# Patient Record
Sex: Male | Born: 1995
Health system: Southern US, Community
[De-identification: ages and names within clinical notes are randomized; demographics above are authoritative.]

## PROBLEM LIST (undated history)

## (undated) DIAGNOSIS — J45909 Unspecified asthma, uncomplicated: Secondary | ICD-10-CM

## (undated) DIAGNOSIS — B20 Human immunodeficiency virus [HIV] disease: Secondary | ICD-10-CM

---
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
## 2015-08-18 ENCOUNTER — Telehealth: Payer: Self-pay

## 2015-08-18 NOTE — Telephone Encounter (Signed)
Called patient to offer new patient intake. Left message on machine.  Tammy K King, RN   

## 2015-09-17 ENCOUNTER — Telehealth: Payer: Self-pay

## 2015-09-17 NOTE — Telephone Encounter (Signed)
Patient missed initial call. He is calling to reschedule.   Return call no answer at [phone] number left on voice mail  Also tried [phone]  Finally left message @ [phone]

## 2015-10-06 ENCOUNTER — Telehealth: Payer: Self-pay

## 2015-10-06 NOTE — Telephone Encounter (Signed)
Patient left message on my voice mail : he is ready to make new intake appointment.    Returned call to [phone] and he was not at home. I left a message to call Erine Phenix with the person who answered the phone.   I will need his insurance information to see if he need a financial counselor appointment prior to scheduling an office visit .

## 2015-10-28 ENCOUNTER — Ambulatory Visit: Payer: BLUE CROSS/BLUE SHIELD | Admitting: *Deleted

## 2015-10-28 ENCOUNTER — Other Ambulatory Visit (HOSPITAL_COMMUNITY)
Admission: RE | Admit: 2015-10-28 | Discharge: 2015-10-28 | Disposition: A | Discharge status: Home / Self Care | Payer: BLUE CROSS/BLUE SHIELD | Source: Ambulatory Visit | Attending: Internal Medicine | Admitting: Internal Medicine

## 2015-10-28 ENCOUNTER — Ambulatory Visit (INDEPENDENT_AMBULATORY_CARE_PROVIDER_SITE_OTHER): Payer: BLUE CROSS/BLUE SHIELD | Admitting: Internal Medicine

## 2015-10-28 VITALS — BP 128/78 | HR 67 | Temp 97.9°F | Ht 70.0 in | Wt 156.0 lb

## 2015-10-28 DIAGNOSIS — Z79899 Other long term (current) drug therapy: Secondary | ICD-10-CM | POA: Diagnosis not present

## 2015-10-28 DIAGNOSIS — Z113 Encounter for screening for infections with a predominantly sexual mode of transmission: Secondary | ICD-10-CM

## 2015-10-28 DIAGNOSIS — B20 Human immunodeficiency virus [HIV] disease: Secondary | ICD-10-CM | POA: Diagnosis not present

## 2015-10-28 DIAGNOSIS — F329 Major depressive disorder, single episode, unspecified: Secondary | ICD-10-CM

## 2015-10-28 DIAGNOSIS — Z23 Encounter for immunization: Secondary | ICD-10-CM

## 2015-10-28 DIAGNOSIS — F32A Depression, unspecified: Secondary | ICD-10-CM

## 2015-10-28 LAB — CBC WITH DIFFERENTIAL/PLATELET
BASOS PCT: 1 %
Basophils Absolute: 27 cells/uL (ref 0–200)
EOS PCT: 8 %
Eosinophils Absolute: 216 cells/uL (ref 15–500)
HCT: 43.5 % (ref 38.5–50.0)
Hemoglobin: 15.1 g/dL (ref 13.2–17.1)
Lymphocytes Relative: 33 %
Lymphs Abs: 891 cells/uL (ref 850–3900)
MCH: 31.3 pg (ref 27.0–33.0)
MCHC: 34.7 g/dL (ref 32.0–36.0)
MCV: 90.2 fL (ref 80.0–100.0)
MONOS PCT: 14 %
MPV: 9.9 fL (ref 7.5–12.5)
Monocytes Absolute: 378 cells/uL (ref 200–950)
NEUTROS ABS: 1188 {cells}/uL — AB (ref 1500–7800)
Neutrophils Relative %: 44 %
PLATELETS: 201 10*3/uL (ref 140–400)
RBC: 4.82 MIL/uL (ref 4.20–5.80)
RDW: 14.3 % (ref 11.0–15.0)
WBC: 2.7 10*3/uL — ABNORMAL LOW (ref 3.8–10.8)

## 2015-10-28 LAB — COMPLETE METABOLIC PANEL WITH GFR
ALBUMIN: 4.1 g/dL (ref 3.6–5.1)
ALK PHOS: 55 U/L (ref 40–115)
ALT: 16 U/L (ref 9–46)
AST: 23 U/L (ref 10–40)
BILIRUBIN TOTAL: 0.7 mg/dL (ref 0.2–1.2)
BUN: 8 mg/dL (ref 7–25)
CO2: 25 mmol/L (ref 20–31)
Calcium: 9.2 mg/dL (ref 8.6–10.3)
Chloride: 104 mmol/L (ref 98–110)
Creat: 1.12 mg/dL (ref 0.60–1.35)
Glucose, Bld: 92 mg/dL (ref 65–99)
Potassium: 4.3 mmol/L (ref 3.5–5.3)
Sodium: 139 mmol/L (ref 135–146)
TOTAL PROTEIN: 7.3 g/dL (ref 6.1–8.1)

## 2015-10-28 LAB — LIPID PANEL
CHOL/HDL RATIO: 4.2 ratio (ref <=5.0)
CHOLESTEROL: 125 mg/dL (ref 125–200)
HDL: 30 mg/dL — AB (ref >=40)
LDL Cholesterol: 84 mg/dL (ref <130)
Triglycerides: 55 mg/dL (ref <150)
VLDL: 11 mg/dL (ref <30)

## 2015-10-28 NOTE — Progress Notes (Signed)
Patient ID: Jacob Jacobson, male    DOB: 09/01/1995, 20 y.o.   MRN: [id number]  Reason for visit: to establish care as a new patient with HIV  HPI:   Patient was first diagnosed about [facility]  He was tested as part routine screening.  Previously tested about 2-3 years prior as negative.  The CD4 count is unknown, viral load unknown.  There have been no associated symptoms.  Some possible lymphadenopathy in axillary area, resolved now.  Does not know when he became infected.  No labs prior to visit.  No medications, no other medical issues.  Mother is a NP and is aware of diagnosis.  Interested in treatment and ok to take daily.  Endorses MSM and heterosexual sex.  No current partners since diagnosis.   PMHx: HIV       SHx: denies smoking, no alcohol  FHx: no renal disease, no heart disease, no HTN in any family members  Review of Systems Constitutional: negative for fatigue, malaise, anorexia and weight loss Cardiovascular: negative for chest pressure/discomfort, dyspnea Gastrointestinal: negative for nausea, vomiting, diarrhea and abdominal pain Genitourinary: negative for frequency and genital lesions and penile discharge Musculoskeletal: negative for myalgias and arthralgias All other systems reviewed and are negative   CONSTITUTIONAL:in no apparent distress and alert  Vitals:   10/28/15 1007  BP: 128/78  Pulse: 67  Temp: 97.9 F (36.6 C)   EYES: anicteric HENT: no thrush CARD:Cor RRR and No murmurs RESP:CTA B; normal respiratory effort GM:WNUUVGI:Bowel sounds are normal, liver is not enlarged, spleen is not enlarged MS:no pedal edema noted SKIN: no rashes NEURO: non-focal GU: no penile lesions, no discharge, no inguinal lad, normal testes  Assessment: new patient here with HIV.  Discussed with patient treatment options and side effects, benefits of treatment, long term outcomes.  I discussed the severity of untreated HIV including higher  cancer risk, opportunistic infections, renal failure.  Also discussed needing to use condoms, partner disclosure, necessary vaccines, blood monitoring.  All questions answered.    Plan: 1) labs today 2) Genvoya if no baseline resistance; discussed with Irving BurtonEmily PharmD 3) follow up with PharmD in about 2 weeks to start Genvoya once labs are back if no issues 4) labs in about 5 weeks (if on medication in 2 weeks)  5) follow up with me in 6 weeks 6) flu shot and pneumovax today 7) hepatitis A and B depending on titers

## 2015-10-28 NOTE — BH Specialist Note (Signed)
Counselor met with Jacob Jacobson in the exam room for a warm handoff as patient is new.  Patient arrived an hour late but was in good spirits. Patient was oriented times four with good affect and dress.  Counselor introduce self and communicated to patient about the counseling services available to him if and when he had a need.  Counselor provided a contact card and encouraged patient to make an appointment anytime.   Rolena Infante, MA, LPC [facility]

## 2015-10-29 LAB — T-HELPER CELL (CD4) - (RCID CLINIC ONLY)
CD4 % Helper T Cell: 25 % — ABNORMAL LOW (ref 33–55)
CD4 T Cell Abs: 240 /uL — ABNORMAL LOW (ref 400–2700)

## 2015-10-29 LAB — URINE CYTOLOGY ANCILLARY ONLY
Chlamydia: NEGATIVE
Neisseria Gonorrhea: NEGATIVE

## 2015-10-29 LAB — HEPATITIS B SURFACE ANTIGEN: Hepatitis B Surface Ag: NEGATIVE

## 2015-10-29 LAB — RPR

## 2015-10-29 LAB — HEPATITIS B SURFACE ANTIBODY,QUALITATIVE: Hep B S Ab: NEGATIVE

## 2015-10-29 LAB — HEPATITIS A ANTIBODY, TOTAL: HEP A TOTAL AB: REACTIVE — AB

## 2015-10-29 LAB — HEPATITIS B CORE ANTIBODY, TOTAL: Hep B Core Total Ab: NONREACTIVE

## 2015-10-29 LAB — HEPATITIS C ANTIBODY: HCV AB: NEGATIVE

## 2015-10-30 LAB — HIV ANTIBODY (ROUTINE TESTING W REFLEX): HIV: REACTIVE — AB

## 2015-10-30 LAB — HIV 1/2 CONFIRMATION
HIV-1 antibody: POSITIVE — AB
HIV-2 Ab: NEGATIVE

## 2015-10-30 LAB — QUANTIFERON TB GOLD ASSAY (BLOOD)
INTERFERON GAMMA RELEASE ASSAY: NEGATIVE
QUANTIFERON NIL VALUE: 0.05 [IU]/mL

## 2015-11-01 LAB — HIV-1 RNA QUANT-NO REFLEX-BLD
HIV 1 RNA Quant: 60440 copies/mL — ABNORMAL HIGH (ref <20)
HIV-1 RNA Quant, Log: 4.78 Log copies/mL — ABNORMAL HIGH (ref <1.30)

## 2015-11-04 ENCOUNTER — Encounter: Payer: Self-pay | Admitting: *Deleted

## 2015-11-04 LAB — HLA B*5701: HLA-B 5701 W/RFLX HLA-B HIGH: NEGATIVE

## 2015-11-11 ENCOUNTER — Ambulatory Visit: Payer: BLUE CROSS/BLUE SHIELD

## 2015-12-02 ENCOUNTER — Other Ambulatory Visit: Payer: BLUE CROSS/BLUE SHIELD

## 2015-12-09 ENCOUNTER — Ambulatory Visit: Payer: BLUE CROSS/BLUE SHIELD | Admitting: Internal Medicine

## 2015-12-21 ENCOUNTER — Ambulatory Visit: Payer: BLUE CROSS/BLUE SHIELD | Admitting: Internal Medicine

## 2015-12-23 ENCOUNTER — Ambulatory Visit: Payer: BLUE CROSS/BLUE SHIELD | Admitting: Internal Medicine

## 2016-02-14 ENCOUNTER — Ambulatory Visit: Payer: BLUE CROSS/BLUE SHIELD | Admitting: Internal Medicine

## 2016-02-24 ENCOUNTER — Telehealth: Payer: Self-pay | Admitting: *Deleted

## 2016-02-24 NOTE — Telephone Encounter (Signed)
RN received a referral from Dr Luciana Axeomer to offer HIV case management services. On review of the chart [place] tested positive the later part of last year and never received treatment for HIV.   RN contact [place] at the number listed and left a vague message since the voicemail does not identify the patient by name. Rn asked that [place] return my call or text.   RN attempted to send a email but the email address provided is not a accurate email address

## 2016-02-25 ENCOUNTER — Telehealth: Payer: Self-pay

## 2016-02-25 NOTE — Telephone Encounter (Signed)
Aliza from [facility] called to ask if Jacob Jacobson was in care at all in our clinic. I told her no and we both confirmed that the last time Pt had been seen was 10/2015. Ot has not yet started treatment following his positive HIV status and his name is now involved in a new case. Multiple sources have tried to contact Pt though phone, mail and e-mail yet there's been no one had yet to talk to Jacob Jacobson.

## 2016-03-20 ENCOUNTER — Other Ambulatory Visit: Payer: BLUE CROSS/BLUE SHIELD

## 2016-04-03 ENCOUNTER — Encounter: Payer: Self-pay | Admitting: Internal Medicine

## 2016-04-03 ENCOUNTER — Ambulatory Visit (INDEPENDENT_AMBULATORY_CARE_PROVIDER_SITE_OTHER): Payer: BLUE CROSS/BLUE SHIELD | Admitting: Internal Medicine

## 2016-04-03 VITALS — BP 110/70 | HR 89 | Temp 98.0°F | Wt 148.0 lb

## 2016-04-03 DIAGNOSIS — Z23 Encounter for immunization: Secondary | ICD-10-CM | POA: Insufficient documentation

## 2016-04-03 DIAGNOSIS — B20 Human immunodeficiency virus [HIV] disease: Secondary | ICD-10-CM | POA: Diagnosis not present

## 2016-04-03 DIAGNOSIS — Z7189 Other specified counseling: Secondary | ICD-10-CM

## 2016-04-03 DIAGNOSIS — Z7185 Encounter for immunization safety counseling: Secondary | ICD-10-CM

## 2016-04-03 MED ORDER — ELVITEG-COBIC-EMTRICIT-TENOFAF 150-150-200-10 MG PO TABS: 1.0000 | ORAL_TABLET | Freq: Every day | ORAL | 5 refills | Status: DC

## 2016-04-03 NOTE — Assessment & Plan Note (Addendum)
Will start genvoya. copay card given.  Labs in 4 weeks and follow up with me after.   Counseled on compliance, side effects.

## 2016-04-03 NOTE — Assessment & Plan Note (Signed)
Meningitis vaccine confirmed in [facility] database.  Hepatitis B series today. Will do #2 at next visit with me.

## 2016-04-03 NOTE — Progress Notes (Signed)
CC: Follow up for HIV  Interval history: I saw once and he didn't return until now.  HIV confimred in October 2017 and CD4 of 240.  Now back and ready to start medication.  No questions.  Had meningitis vaccine with his school.  Hepatitis B non-immune.  No new issues including no n/v/d.  No weight loss.    Prior to Admission medications   Medication Sig Start Date End Date Taking? Authorizing Provider  elvitegravir-cobicistat-emtricitabine-tenofovir (GENVOYA) 150-150-200-10 MG TABS tablet Take 1 tablet by mouth daily. 04/03/16   Gardiner Barefoot, MD    Review of Systems Constitutional: negative for fatigue and malaise Musculoskeletal: negative for myalgias and arthralgias All other systems reviewed and are negative    Physical Exam: CONSTITUTIONAL:in no apparent distress and alert  Vitals:   04/03/16 1043  BP: 110/70  Pulse: 89  Temp: 98 F (36.7 C)   Eyes: anicteric HENT: no thrush, no cervical lymphadenopathy Respiratory: Normal respiratory effort; CTA B Cardiovascular: RRR  Lab Results  Component Value Date   HIV1RNAQUANT 60,440 (H) 10/28/2015   No components found for: HIV1GENOTYPRPLUS No components found for: THELPERCELL

## 2016-04-24 ENCOUNTER — Other Ambulatory Visit: Payer: BLUE CROSS/BLUE SHIELD

## 2016-08-14 ENCOUNTER — Emergency Department (HOSPITAL_COMMUNITY)
Admission: EM | Admit: 2016-08-14 | Discharge: 2016-08-15 | Disposition: A | Discharge status: Home / Self Care | Payer: BLUE CROSS/BLUE SHIELD | Attending: Emergency Medicine | Admitting: Emergency Medicine

## 2016-08-14 ENCOUNTER — Emergency Department (HOSPITAL_COMMUNITY): Payer: BLUE CROSS/BLUE SHIELD

## 2016-08-14 ENCOUNTER — Encounter (HOSPITAL_COMMUNITY): Payer: Self-pay | Admitting: Emergency Medicine

## 2016-08-14 DIAGNOSIS — B20 Human immunodeficiency virus [HIV] disease: Secondary | ICD-10-CM | POA: Diagnosis not present

## 2016-08-14 DIAGNOSIS — S61511A Laceration without foreign body of right wrist, initial encounter: Secondary | ICD-10-CM | POA: Diagnosis not present

## 2016-08-14 DIAGNOSIS — Y939 Activity, unspecified: Secondary | ICD-10-CM | POA: Insufficient documentation

## 2016-08-14 DIAGNOSIS — Y92008 Other place in unspecified non-institutional (private) residence as the place of occurrence of the external cause: Secondary | ICD-10-CM | POA: Diagnosis not present

## 2016-08-14 DIAGNOSIS — J45909 Unspecified asthma, uncomplicated: Secondary | ICD-10-CM | POA: Diagnosis not present

## 2016-08-14 DIAGNOSIS — Z23 Encounter for immunization: Secondary | ICD-10-CM | POA: Diagnosis not present

## 2016-08-14 DIAGNOSIS — Y999 Unspecified external cause status: Secondary | ICD-10-CM | POA: Insufficient documentation

## 2016-08-14 DIAGNOSIS — W25XXXA Contact with sharp glass, initial encounter: Secondary | ICD-10-CM | POA: Insufficient documentation

## 2016-08-14 HISTORY — DX: Unspecified asthma, uncomplicated: J45.909

## 2016-08-14 HISTORY — DX: Human immunodeficiency virus (HIV) disease: B20

## 2016-08-14 MED ORDER — LIDOCAINE-EPINEPHRINE (PF) 2 %-1:200000 IJ SOLN
20.0000 mL | Freq: Once | INTRAMUSCULAR | Status: AC
  Administered 2016-08-14: 20 mL

## 2016-08-14 MED ORDER — TETANUS-DIPHTH-ACELL PERTUSSIS 5-2.5-18.5 LF-MCG/0.5 IM SUSP
0.5000 mL | Freq: Once | INTRAMUSCULAR | Status: AC
  Administered 2016-08-14: 0.5 mL via INTRAMUSCULAR
  Filled 2016-08-14: qty 0.5

## 2016-08-14 NOTE — ED Provider Notes (Signed)
[facility]-EMERGENCY DEPT Provider Note   CSN: [id number] Arrival date & time: 08/14/16  2113  By signing my name below, I, Diona BrownerJennifer Gorman, attest that this documentation has been prepared under the direction and in the presence of Mathews RobinsonsJessica Zendayah Hardgrave, PA-C. Electronically Signed: Diona BrownerJennifer Gorman, ED Scribe. 08/14/16. 11:40 PM.  History   Chief Complaint Chief Complaint  Patient presents with  . Extremity Laceration    HPI Jacob Jacobson is a 21 y.o. male with a PMHx of HIV and asthma, who presents to the Emergency Department complaining of a wound to his right wrist that occurred PTA. Pt was banging on the glass part of the door at his grandma's house when the glass broke and cut his wrist and right middle finger knuckle. Bleeding controlled. Pt didn't take anything for his pain. He is not taking any medication for his HIV, because he hasn't set up his card yet. Notes he will be doing that later this week. Tingling to his forearm. Pt denies fever, chills, numbness, weakness, or any other complaints at this time.   The history is provided by the patient. No language interpreter was used.    Past Medical History:  Diagnosis Date  . Asthma   . HIV (human immunodeficiency virus infection) [facility](HCC)     Patient Active Problem List   Diagnosis Date Noted  . Vaccine counseling 04/03/2016  . Human immunodeficiency virus I infection (HCC) 10/28/2015  . Screening examination for venereal disease 10/28/2015  . Encounter for long-term (current) use of medications 10/28/2015    History reviewed. No pertinent surgical history.     Home Medications    Prior to Admission medications   Medication Sig Start Date End Date Taking? Authorizing Provider  cephALEXin (KEFLEX) 500 MG capsule Take 1 capsule (500 mg total) by mouth 2 (two) times daily. 08/15/16 08/20/16  Georgiana ShoreMitchell, Eupha Lobb B, PA-C  elvitegravir-cobicistat-emtricitabine-tenofovir (GENVOYA) 150-150-200-10 MG TABS tablet Take 1 tablet by mouth  daily. 04/03/16   Comer, Belia Hemanobert W, MD    Family History No family history on file.  Social History Social History  Substance Use Topics  . Smoking status: Never Smoker  . Smokeless tobacco: Never Used  . Alcohol use No     Allergies   Patient has no known allergies.   Review of Systems Review of Systems  Constitutional: Negative for chills and fever.  Respiratory: Negative for shortness of breath.   Cardiovascular: Negative for chest pain.  Gastrointestinal: Negative for nausea and vomiting.  Skin: Positive for wound. Negative for pallor.  Neurological: Negative for dizziness, weakness, light-headedness and numbness.     Physical Exam Updated Vital Signs BP 113/75 (BP Location: Left Arm)   Pulse 88   Temp 98.4 F (36.9 C) (Oral)   Resp 16   SpO2 98%   Physical Exam  Constitutional: He appears well-developed and well-nourished. No distress.  Patient is afebrile, non-toxic appearing, sitting comfortably in chair in no acute distress.  HENT:  Head: Normocephalic and atraumatic.  Eyes: Conjunctivae are normal.  Neck: Normal range of motion.  Cardiovascular: Normal rate, regular rhythm, normal heart sounds and intact distal pulses.   Pulmonary/Chest: Effort normal and breath sounds normal. No respiratory distress. He has no wheezes. He has no rales.  Abdominal: He exhibits no distension.  Musculoskeletal: Normal range of motion. He exhibits no edema.       Right wrist: He exhibits laceration.       Arms:      Hands: Neurological: He is alert. No  sensory deficit.  5/5 grips bilaterally  Skin: Skin is warm and dry. He is not diaphoretic. No pallor.  Psychiatric: He has a normal mood and affect. His behavior is normal.  Nursing note and vitals reviewed.          ED Treatments / Results  DIAGNOSTIC STUDIES: Oxygen Saturation is 98% on RA, normal by my interpretation.   COORDINATION OF CARE: 11:40 PM-Discussed next steps with pt. Pt verbalized  understanding and is agreeable with the plan.   Labs (all labs ordered are listed, but only abnormal results are displayed) Labs Reviewed - No data to display  EKG  EKG Interpretation None       Radiology Dg Forearm Right  Result Date: 08/14/2016 CLINICAL DATA:  Lacerations to the right forearm from glass window. Initial encounter. EXAM: RIGHT FOREARM - 2 VIEW COMPARISON:  None. FINDINGS: There is no evidence of fracture or dislocation. A prominent soft tissue laceration is noted overlying the distal ulna. No radiopaque foreign bodies are seen. The carpal rows appear grossly intact, and demonstrate normal alignment. The elbow joint is grossly unremarkable in appearance. IMPRESSION: No evidence of fracture or dislocation. No radiopaque foreign bodies seen. Electronically Signed   By: Roanna Raider M.D.   On: 08/14/2016 21:58   Dg Hand Complete Right  Result Date: 08/14/2016 CLINICAL DATA:  Laceration to the right third finger from glass window. Initial encounter. EXAM: RIGHT HAND - COMPLETE 3+ VIEW COMPARISON:  None. FINDINGS: There is no evidence of fracture or dislocation. The joint spaces are preserved. The carpal rows are intact, and demonstrate normal alignment. The soft tissue laceration at the third finger is not well characterized. No radiopaque foreign bodies are seen. IMPRESSION: No evidence of fracture or dislocation. No radiopaque foreign bodies seen. Electronically Signed   By: Roanna Raider M.D.   On: 08/14/2016 21:59    Procedures Procedures (including critical care time) LACERATION REPAIR #1 Performed by: Georgiana Shore Authorized by: Georgiana Shore Consent: Verbal consent obtained. Risks and benefits: risks, benefits and alternatives were discussed Consent given by: patient Patient identity confirmed: provided demographic data Prepped and Draped in normal sterile fashion Wound explored  Laceration Location: right wrist lateral  Laceration Length: 1  cm  No Foreign Bodies seen or palpated  Anesthesia: local infiltration  Local anesthetic: lidocaine 2% with epinephrine  Anesthetic total: 2 ml  Irrigation method: syringe Amount of cleaning: standard  Skin closure: non-absorbable prolene  Number of sutures: 3  Technique: simple interrupted  Patient tolerance: Patient tolerated the procedure well with no immediate complications.  LACERATION REPAIR #2 Performed by: Georgiana Shore Authorized by: Georgiana Shore Consent: Verbal consent obtained. Risks and benefits: risks, benefits and alternatives were discussed Consent given by: patient Patient identity confirmed: provided demographic data Prepped and Draped in normal sterile fashion Wound explored  Laceration Location: right wrist midline  Laceration Length: 5cm  No Foreign Bodies seen or palpated  Anesthesia: local infiltration  Local anesthetic: lidocaine 2% with epinephrine  Anesthetic total: 6 ml  Irrigation method: syringe Amount of cleaning: standard  Skin closure: 5.0 prolene  Number of sutures: 6  Technique: horizontal mattress (1), stellate (1), simple interrupted (4)  Patient tolerance: Patient tolerated the procedure well with no immediate complications.  LACERATION REPAIR #3 Performed by: Georgiana Shore Authorized by: Georgiana Shore Consent: Verbal consent obtained. Risks and benefits: risks, benefits and alternatives were discussed Consent given by: patient Patient identity confirmed: provided demographic data Prepped and  Draped in normal sterile fashion Wound explored  Laceration Location: right wrist  Laceration Length: 3 cm  No Foreign Bodies seen or palpated  Anesthesia: local infiltration  Local anesthetic: lidocaine 2% with epinephrine  Anesthetic total: 4 ml  Irrigation method: syringe Amount of cleaning: standard  Skin closure: 5.0 prolene  Number of sutures: 7  Technique: horizontal mattress (1),  simple interrupted (6)  Patient tolerance: Patient tolerated the procedure well with no immediate complications.  SPLINT APPLICATION Date/Time: 2:25 AM Authorized by: Georgiana Shore Consent: Verbal consent obtained. Risks and benefits: risks, benefits and alternatives were discussed Consent given by: patient Splint applied by: orthopedic technician Location details: right wrist Splint type: volar long arm Post-procedure: The splinted body part was neurovascularly unchanged following the procedure. Patient tolerance: Patient tolerated the procedure well with no immediate complications.    Medications Ordered in ED Medications  acetaminophen (TYLENOL) tablet 650 mg (not administered)  lidocaine-EPINEPHrine (XYLOCAINE W/EPI) 2 %-1:200000 (PF) injection 20 mL (20 mLs Infiltration Given by Other 08/14/16 2359)  Tdap (BOOSTRIX) injection 0.5 mL (0.5 mLs Intramuscular Given 08/14/16 2353)     Initial Impression / Assessment and Plan / ED Course  I have reviewed the triage vital signs and the nursing notes.  Pertinent labs & imaging results that were available during my care of the patient were reviewed by me and considered in my medical decision making (see chart for details).     Patient presents with 3 lacerations to his right wrist. imaging negative for fx, dislocation or foreign body.  On exam, he is NVI distally, with strong grips bilaterally. Visible flexor tendon may have sustained tear. Patient had full rom and strong grips.  Discussed with Dr. Bebe Shaggy who examined the wound and recommends loose closure and close follow up with Hand when the office opens in the am.  Pressure irrigation performed. Wound explored and base of wound visualized in a bloodless field without evidence of foreign body.  Laceration occurred < 8 hours prior to repair which was well tolerated.  Tdap updated.  Pt has comorbidities to effect normal wound healing. Pt discharged with antibiotics.     Discussed suture home care with patient and answered questions. Pt to follow-up for wound check and suture removal in 7-10 days; they are to return to the ED sooner for signs of infection. Pt is hemodynamically stable with no complaints prior to dc.   Discharge home with abx and close follow up with Hand in the morning.  Discussed strict return precautions and advised to return to the emergency department if experiencing any new or worsening symptoms. Instructions were understood and patient agreed with discharge plan.  Final Clinical Impressions(s) / ED Diagnoses   Final diagnoses:  Laceration of right wrist, initial encounter    New Prescriptions New Prescriptions   CEPHALEXIN (KEFLEX) 500 MG CAPSULE    Take 1 capsule (500 mg total) by mouth 2 (two) times daily.   I personally performed the services described in this documentation, which was scribed in my presence. The recorded information has been reviewed and is accurate.     Georgiana Shore, PA-C 08/15/16 0231    Zadie Rhine, MD 08/15/16 [phone]

## 2016-08-14 NOTE — ED Triage Notes (Signed)
Patient was trying to get into his Grandma's house, knocking on the door which had a glass window and it busted and lacerated his right wrist.  Bleeding controlled.

## 2016-08-15 MED ORDER — ACETAMINOPHEN 325 MG PO TABS
650.0000 mg | ORAL_TABLET | Freq: Once | ORAL | Status: AC
  Administered 2016-08-15: 650 mg via ORAL
  Filled 2016-08-15: qty 2

## 2016-08-15 MED ORDER — CEPHALEXIN 500 MG PO CAPS: 500.0000 mg | ORAL_CAPSULE | Freq: Two times a day (BID) | ORAL | 0 refills | Status: AC

## 2016-08-15 NOTE — ED Notes (Signed)
Patient Alert and oriented X4. Stable and ambulatory. Patient verbalized understanding of the discharge instructions.  Patient belongings were taken by the patient.  

## 2016-08-15 NOTE — ED Notes (Signed)
Wounds repaired with sutures and bacitracin applied. Wounds dressed with non adherent dressing, gauze and coban.  Awaiting ortho tech for long arm splint.

## 2016-08-15 NOTE — ED Notes (Signed)
Ortho Tech at bedside.  

## 2016-08-15 NOTE — ED Provider Notes (Signed)
Patient seen/examined in the Emergency Department in conjunction with Midlevel Provider  Patient reports laceration to right wrist Exam : awake/alert, wound noted to volar surface, tendon exposed but he does not appear to have any weakness with wrist flexion or flexion of fingers Plan: wound closure, volar splint and f/u with Hand surgery today     Zadie RhineWickline, Wandy Bossler, MD 08/15/16 0150

## 2016-08-15 NOTE — Discharge Instructions (Signed)
As discussed, follow up with Dr. Mina MarbleWeingold first thing in the morning.   Monitor for any signs of infection. Return to be seen if swelling, increased pain, purulence, redness, warmth, fever or other concerning symptoms.  Your sutures will need to be removed in 7-10 days. Keep the area clean and dry and change bandages daily.

## 2016-08-15 NOTE — ED Notes (Signed)
[facility]

## 2017-09-19 ENCOUNTER — Encounter (HOSPITAL_COMMUNITY): Payer: Self-pay | Admitting: Obstetrics and Gynecology

## 2017-09-19 ENCOUNTER — Other Ambulatory Visit: Payer: Self-pay

## 2017-09-19 DIAGNOSIS — Z21 Asymptomatic human immunodeficiency virus [HIV] infection status: Secondary | ICD-10-CM | POA: Diagnosis not present

## 2017-09-19 DIAGNOSIS — S8992XA Unspecified injury of left lower leg, initial encounter: Secondary | ICD-10-CM | POA: Diagnosis present

## 2017-09-19 DIAGNOSIS — Y999 Unspecified external cause status: Secondary | ICD-10-CM | POA: Insufficient documentation

## 2017-09-19 DIAGNOSIS — Y9343 Activity, gymnastics: Secondary | ICD-10-CM | POA: Diagnosis not present

## 2017-09-19 DIAGNOSIS — Z79899 Other long term (current) drug therapy: Secondary | ICD-10-CM | POA: Insufficient documentation

## 2017-09-19 DIAGNOSIS — X501XXA Overexertion from prolonged static or awkward postures, initial encounter: Secondary | ICD-10-CM | POA: Diagnosis not present

## 2017-09-19 DIAGNOSIS — Y929 Unspecified place or not applicable: Secondary | ICD-10-CM | POA: Insufficient documentation

## 2017-09-19 DIAGNOSIS — S76312A Strain of muscle, fascia and tendon of the posterior muscle group at thigh level, left thigh, initial encounter: Secondary | ICD-10-CM | POA: Diagnosis not present

## 2017-09-19 DIAGNOSIS — J45909 Unspecified asthma, uncomplicated: Secondary | ICD-10-CM | POA: Insufficient documentation

## 2017-09-19 NOTE — ED Triage Notes (Signed)
Pt reports that he "pulled his gluteus muscle" and that he is having pain in his buttocks and left leg.

## 2017-09-20 ENCOUNTER — Emergency Department (HOSPITAL_COMMUNITY)
Admission: EM | Admit: 2017-09-20 | Discharge: 2017-09-20 | Disposition: A | Discharge status: Home / Self Care | Payer: BLUE CROSS/BLUE SHIELD | Attending: Emergency Medicine | Admitting: Emergency Medicine

## 2017-09-20 DIAGNOSIS — S76312A Strain of muscle, fascia and tendon of the posterior muscle group at thigh level, left thigh, initial encounter: Secondary | ICD-10-CM

## 2017-09-20 MED ORDER — TRAMADOL HCL 50 MG PO TABS: 50.0000 mg | ORAL_TABLET | Freq: Four times a day (QID) | ORAL | 0 refills | Status: AC | PRN

## 2017-09-20 MED ORDER — TRAMADOL HCL 50 MG PO TABS
50.0000 mg | ORAL_TABLET | Freq: Once | ORAL | Status: AC
  Administered 2017-09-20: 50 mg via ORAL
  Filled 2017-09-20: qty 1

## 2017-09-20 MED ORDER — NAPROXEN 500 MG PO TABS: 500.0000 mg | ORAL_TABLET | Freq: Two times a day (BID) | ORAL | 0 refills | Status: AC

## 2017-09-20 NOTE — ED Provider Notes (Signed)
[facility]-EMERGENCY DEPT Provider Note   CSN: [id number] Arrival date & time: 09/19/17  2205     History   Chief Complaint Chief Complaint  Patient presents with  . Leg Pain    HPI Jacob Jacobson is a 22 y.o. male.  Patient is a 22 year old male with history of asthma and HIV disease.  He presents today for evaluation of leg pain.  He reports attempting to do a split several days ago when he felt a pull in his upper left hamstring.  He has been experiencing pain since that time.  It is worse when he ambulates and walks.  He denies any numbness or weakness.  The history is provided by the patient.  Leg Pain   This is a new problem. Episode onset: 3 to 4 days ago. The problem occurs constantly. The problem has not changed since onset.Pain location: Left upper leg. The pain is moderate. Pertinent negatives include no numbness.    Past Medical History:  Diagnosis Date  . Asthma   . HIV (human immunodeficiency virus infection) [facility](HCC)     Patient Active Problem List   Diagnosis Date Noted  . Vaccine counseling 04/03/2016  . Human immunodeficiency virus I infection (HCC) 10/28/2015  . Screening examination for venereal disease 10/28/2015  . Encounter for long-term (current) use of medications 10/28/2015    No past surgical history on file.      Home Medications    Prior to Admission medications   Medication Sig Start Date End Date Taking? Authorizing Provider  elvitegravir-cobicistat-emtricitabine-tenofovir (GENVOYA) 150-150-200-10 MG TABS tablet Take 1 tablet by mouth daily. 04/03/16   Comer, Belia Hemanobert W, MD    Family History No family history on file.  Social History Social History   Tobacco Use  . Smoking status: Never Smoker  . Smokeless tobacco: Never Used  Substance Use Topics  . Alcohol use: No  . Drug use: Yes    Types: Marijuana     Allergies   Patient has no known allergies.   Review of Systems Review of Systems    Neurological: Negative for numbness.  All other systems reviewed and are negative.    Physical Exam Updated Vital Signs BP 112/63 (BP Location: Left Arm)   Pulse 89   Temp 98.3 F (36.8 C)   Resp 16   SpO2 100%   Physical Exam  Constitutional: He is oriented to person, place, and time. He appears well-developed and well-nourished. No distress.  HENT:  Head: Normocephalic and atraumatic.  Neck: Normal range of motion. Neck supple.  Musculoskeletal:  The left thigh and buttock appear grossly normal.  There is tenderness in the upper hamstring.  He is able to flex the knee, however with discomfort.  Distal PMS to the lower extremity is intact.  Neurological: He is alert and oriented to person, place, and time.  Skin: Skin is warm and dry. He is not diaphoretic.  Nursing note and vitals reviewed.    ED Treatments / Results  Labs (all labs ordered are listed, but only abnormal results are displayed) Labs Reviewed - No data to display  EKG None  Radiology No results found.  Procedures Procedures (including critical care time)  Medications Ordered in ED Medications - No data to display   Initial Impression / Assessment and Plan / ED Course  I have reviewed the triage vital signs and the nursing notes.  Pertinent labs & imaging results that were available during my care of the patient were  reviewed by me and considered in my medical decision making (see chart for details).  Patient appears to have a hamstring sprain or partial tear.  This will be treated with naproxen, tramadol, and follow-up as needed if not improving.  Final Clinical Impressions(s) / ED Diagnoses   Final diagnoses:  None    ED Discharge Orders    None       Geoffery Lyons, MD 09/20/17 [phone]

## 2017-09-20 NOTE — Discharge Instructions (Addendum)
Naproxen as prescribed.  Tramadol as prescribed as needed for pain not relieved with naproxen.  Follow-up with your primary doctor if symptoms are not improving in the next 7 to 10 days.

## 2018-03-08 IMAGING — DX DG HAND COMPLETE 3+V*R*
3 series · 3 of 3 positions shown · non-contrast
Comparison: None.

CLINICAL DATA: Laceration to the right third finger from glass
window. Initial encounter.

EXAM:
RIGHT HAND - COMPLETE 3+ VIEW

[hand pa]
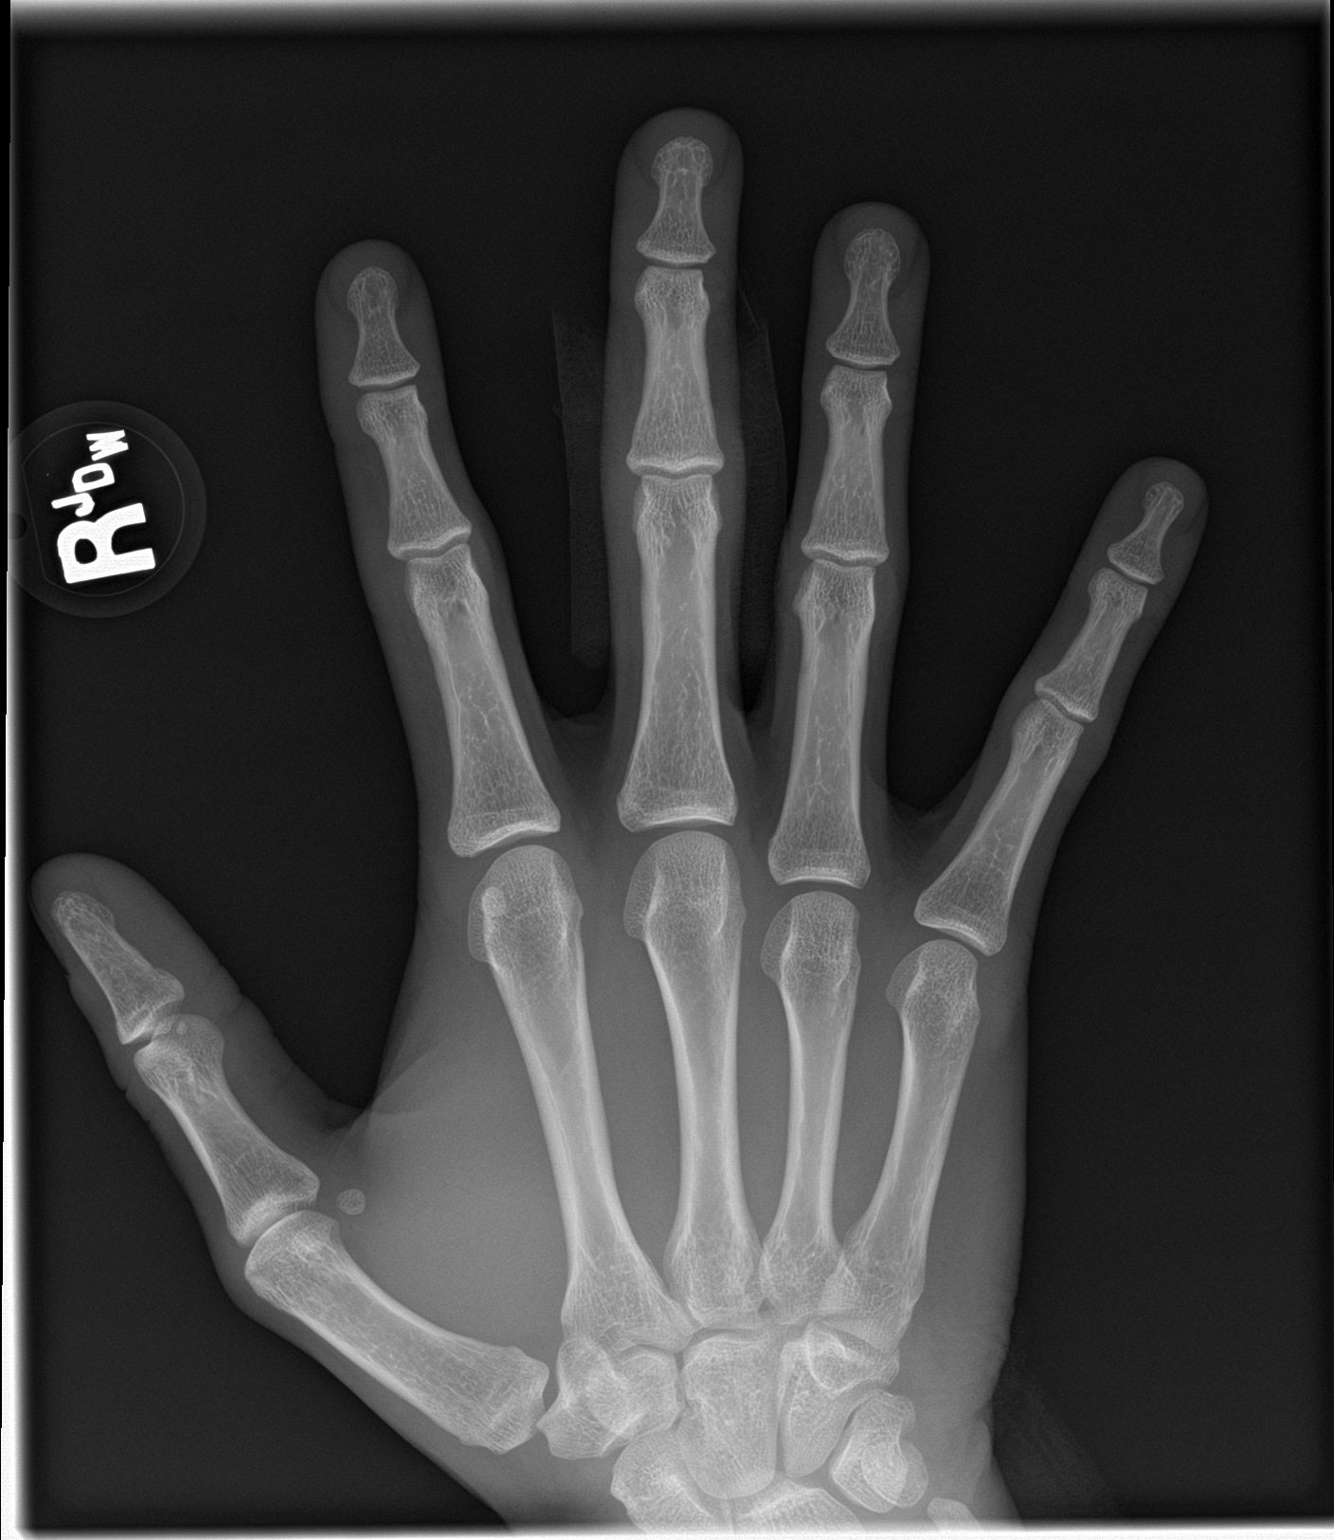

[hand obl]
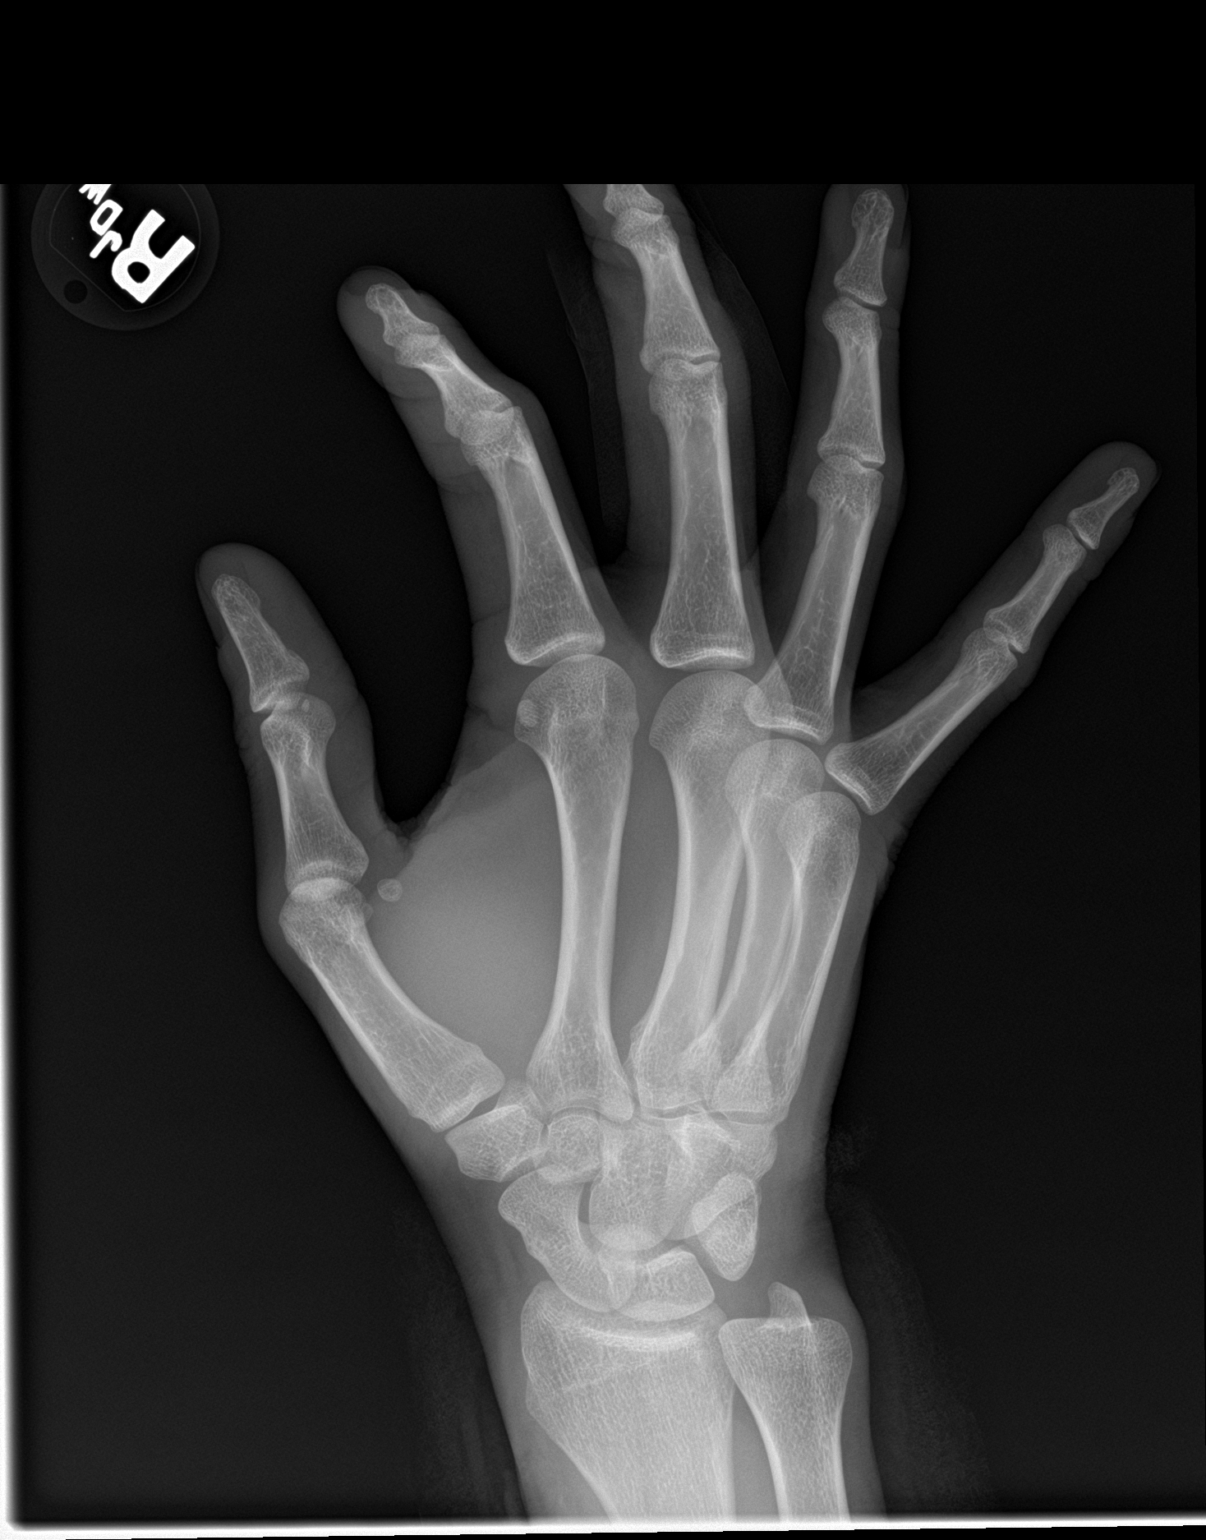

[hand lat]
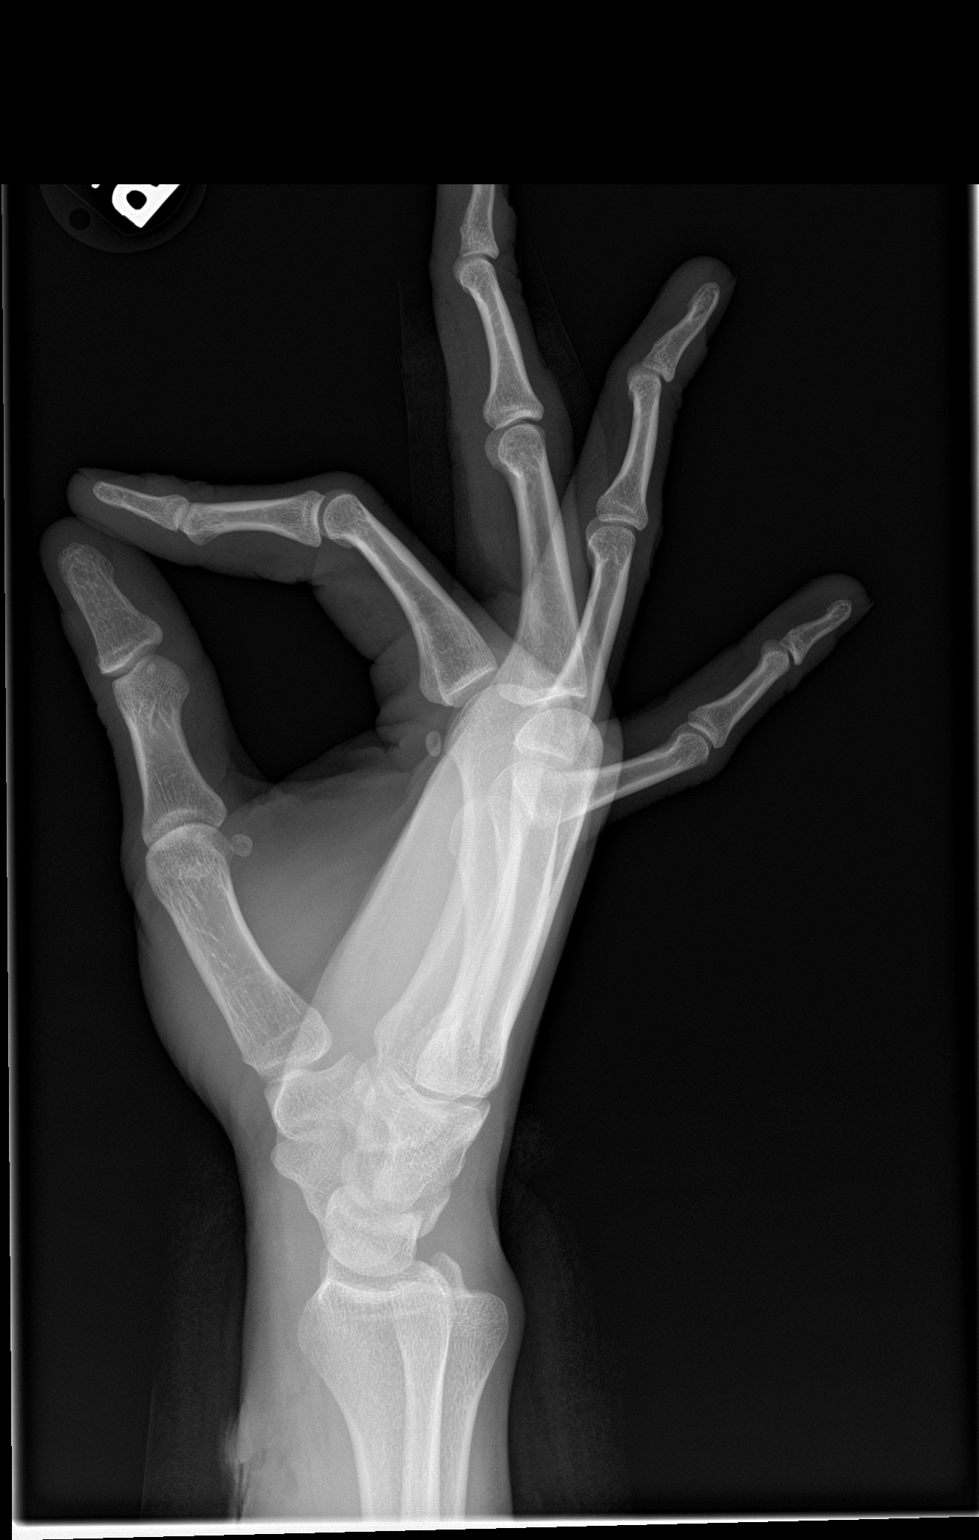

[3 of 3 positions shown; findings below may reference images not displayed]

FINDINGS: There is no evidence of fracture or dislocation. The joint spaces
are preserved. The carpal rows are intact, and demonstrate normal
alignment. The soft tissue laceration at the third finger is not
well characterized. No radiopaque foreign bodies are seen.
IMPRESSION: No evidence of fracture or dislocation. No radiopaque foreign bodies
seen.

## 2018-07-10 ENCOUNTER — Other Ambulatory Visit: Payer: Self-pay | Admitting: *Deleted

## 2018-07-10 ENCOUNTER — Other Ambulatory Visit: Payer: BLUE CROSS/BLUE SHIELD

## 2018-07-10 DIAGNOSIS — B20 Human immunodeficiency virus [HIV] disease: Secondary | ICD-10-CM

## 2018-07-10 DIAGNOSIS — Z113 Encounter for screening for infections with a predominantly sexual mode of transmission: Secondary | ICD-10-CM

## 2018-07-11 ENCOUNTER — Other Ambulatory Visit: Payer: 59

## 2018-07-17 ENCOUNTER — Other Ambulatory Visit (HOSPITAL_COMMUNITY)
Admission: RE | Admit: 2018-07-17 | Discharge: 2018-07-17 | Disposition: A | Discharge status: Home / Self Care | Payer: 59 | Source: Ambulatory Visit | Attending: Infectious Diseases | Admitting: Infectious Diseases

## 2018-07-17 ENCOUNTER — Other Ambulatory Visit: Payer: 59

## 2018-07-17 ENCOUNTER — Other Ambulatory Visit: Payer: Self-pay

## 2018-07-17 DIAGNOSIS — Z113 Encounter for screening for infections with a predominantly sexual mode of transmission: Secondary | ICD-10-CM | POA: Insufficient documentation

## 2018-07-17 DIAGNOSIS — B20 Human immunodeficiency virus [HIV] disease: Secondary | ICD-10-CM

## 2018-07-18 LAB — T-HELPER CELL (CD4) - (RCID CLINIC ONLY)
CD4 % Helper T Cell: 18 % — ABNORMAL LOW (ref 33–65)
CD4 T Cell Abs: 166 /uL — ABNORMAL LOW (ref 400–1790)

## 2018-07-18 LAB — URINE CYTOLOGY ANCILLARY ONLY
Chlamydia: NEGATIVE
Neisseria Gonorrhea: NEGATIVE

## 2018-07-29 ENCOUNTER — Encounter: Payer: BLUE CROSS/BLUE SHIELD | Admitting: Infectious Diseases

## 2018-08-06 LAB — COMPLETE METABOLIC PANEL WITH GFR
AG Ratio: 1 (calc) (ref 1.0–2.5)
ALT: 7 U/L — ABNORMAL LOW (ref 9–46)
AST: 20 U/L (ref 10–40)
Albumin: 4 g/dL (ref 3.6–5.1)
Alkaline phosphatase (APISO): 68 U/L (ref 36–130)
BUN: 13 mg/dL (ref 7–25)
CO2: 28 mmol/L (ref 20–32)
Calcium: 9.1 mg/dL (ref 8.6–10.3)
Chloride: 103 mmol/L (ref 98–110)
Creat: 1 mg/dL (ref 0.60–1.35)
GFR, Est African American: 121 mL/min/{1.73_m2} (ref >=60)
GFR, Est Non African American: 104 mL/min/{1.73_m2} (ref >=60)
Globulin: 4.2 g/dL (calc) — ABNORMAL HIGH (ref 1.9–3.7)
Glucose, Bld: 86 mg/dL (ref 65–99)
Potassium: 4.2 mmol/L (ref 3.5–5.3)
Sodium: 138 mmol/L (ref 135–146)
Total Bilirubin: 0.6 mg/dL (ref 0.2–1.2)
Total Protein: 8.2 g/dL — ABNORMAL HIGH (ref 6.1–8.1)

## 2018-08-06 LAB — CBC WITH DIFFERENTIAL/PLATELET
Absolute Monocytes: 512 cells/uL (ref 200–950)
Basophils Absolute: 30 cells/uL (ref 0–200)
Basophils Relative: 0.7 %
Eosinophils Absolute: 344 cells/uL (ref 15–500)
Eosinophils Relative: 8 %
HCT: 38.7 % (ref 38.5–50.0)
Hemoglobin: 12.8 g/dL — ABNORMAL LOW (ref 13.2–17.1)
Lymphs Abs: 976 cells/uL (ref 850–3900)
MCH: 28.3 pg (ref 27.0–33.0)
MCHC: 33.1 g/dL (ref 32.0–36.0)
MCV: 85.4 fL (ref 80.0–100.0)
MPV: 10.5 fL (ref 7.5–12.5)
Monocytes Relative: 11.9 %
Neutro Abs: 2438 cells/uL (ref 1500–7800)
Neutrophils Relative %: 56.7 %
Platelets: 269 10*3/uL (ref 140–400)
RBC: 4.53 10*6/uL (ref 4.20–5.80)
RDW: 14.5 % (ref 11.0–15.0)
Total Lymphocyte: 22.7 %
WBC: 4.3 10*3/uL (ref 3.8–10.8)

## 2018-08-06 LAB — HIV-1 RNA ULTRAQUANT REFLEX TO GENTYP+
HIV 1 RNA Quant: 47400 copies/mL — ABNORMAL HIGH
HIV-1 RNA Quant, Log: 4.68 Log copies/mL — ABNORMAL HIGH

## 2018-08-06 LAB — RPR: RPR Ser Ql: NONREACTIVE

## 2018-08-06 LAB — HIV-1 GENOTYPE: HIV-1 Genotype: DETECTED — AB

## 2018-08-13 ENCOUNTER — Ambulatory Visit (INDEPENDENT_AMBULATORY_CARE_PROVIDER_SITE_OTHER): Payer: 59 | Admitting: Infectious Diseases

## 2018-08-13 ENCOUNTER — Encounter: Payer: Self-pay | Admitting: Infectious Diseases

## 2018-08-13 ENCOUNTER — Other Ambulatory Visit: Payer: Self-pay

## 2018-08-13 VITALS — BP 136/82 | HR 86 | Temp 97.9°F | Wt 148.0 lb

## 2018-08-13 DIAGNOSIS — Z23 Encounter for immunization: Secondary | ICD-10-CM | POA: Diagnosis not present

## 2018-08-13 DIAGNOSIS — Z9119 Patient's noncompliance with other medical treatment and regimen: Secondary | ICD-10-CM

## 2018-08-13 DIAGNOSIS — B20 Human immunodeficiency virus [HIV] disease: Secondary | ICD-10-CM | POA: Diagnosis not present

## 2018-08-13 DIAGNOSIS — L0292 Furuncle, unspecified: Secondary | ICD-10-CM | POA: Diagnosis not present

## 2018-08-13 DIAGNOSIS — Z91199 Patient's noncompliance with other medical treatment and regimen due to unspecified reason: Secondary | ICD-10-CM

## 2018-08-13 MED ORDER — BIKTARVY 50-200-25 MG PO TABS: 1.0000 | ORAL_TABLET | Freq: Every day | ORAL | 1 refills | Status: DC

## 2018-08-13 MED ORDER — SULFAMETHOXAZOLE-TRIMETHOPRIM 800-160 MG PO TABS: 1.0000 | ORAL_TABLET | Freq: Once | ORAL | 1 refills | Status: DC

## 2018-08-13 MED FILL — SULFAMETHOXAZOLE-TMP DS TAB: 800-160 | 30 days supply | Qty: 30 | Fill #0

## 2018-08-13 MED FILL — BIKTARVY 50-200-25 MG TABS: 50-200-25 | 30 days supply | Qty: 30 | Fill #0

## 2018-08-13 NOTE — Patient Instructions (Signed)
Start biktarvy and bactrim DS 1 tab daily. Miss no doses of medications. Return to clinic in 4 weeks for repeat labs. Return to clinic in 6 weeks for appointment with Dr. Prince Rome.

## 2018-08-13 NOTE — Progress Notes (Signed)
Jacob Jacobson  [id number]  March 20, 1995    HPI: The patient is a 23 y.o. y/o AA male presenting today to re-establish care for HIV infection. He was last seen in our clinic on 04/03/2016 by Dr. Linus Salmons.  He did briefly move out of town and was lost to follow-up after that time.  He now states that he was "not prepared" to deal with his HIV at that time.  His most recent CD4 count was 166 and concurrent HIV viral load was 47,400 copies on no ARVs as he has been off tx for over 2 years now. His CD4 nadir is 166. His HIV was diagnosed in 10/17 at the [facility]. HD. His past ARV experience includes genvoya. Prior changes in ARVs were not due to the development of ARV resistance. The patient has a known xxx mutation. Review of prior HIV records does not indicate good compliance with ARV medications or visits. Aside from malaise/fatigue, his only complaint today is recurrent furunculosis/skin lesions, but he is now anxious to resume ARV tx.   Past Medical History:  Diagnosis Date  . Asthma   . HIV (human immunodeficiency virus infection) ([place])     No past surgical history on file.   No family history on file. Mother healthy (she is an NP). He denies cardiac, renal dz, or HTN in his family.  Social History   Tobacco Use  . Smoking status: Never Smoker  . Smokeless tobacco: Never Used  Substance Use Topics  . Alcohol use: No  . Drug use: Yes    Types: Marijuana      reports previously being sexually active. bisexual, prefers MSM more recently.  Outpatient Medications Prior to Visit  Medication Sig Dispense Refill  . naproxen (NAPROSYN) 500 MG tablet Take 1 tablet (500 mg total) by mouth 2 (two) times daily. (Patient not taking: Reported on 08/13/2018) 15 tablet 0  . traMADol (ULTRAM) 50 MG tablet Take 1 tablet (50 mg total) by mouth every 6 (six) hours as needed. (Patient not taking: Reported on 08/13/2018) 15 tablet 0  . elvitegravir-cobicistat-emtricitabine-tenofovir (GENVOYA)  150-150-200-10 MG TABS tablet Take 1 tablet by mouth daily. (Patient not taking: Reported on 08/13/2018) 30 tablet 5   No facility-administered medications prior to visit.      No Known Allergies   Review of Systems  Constitutional: Negative for chills, fatigue and fever.  HENT: Negative for congestion, hearing loss, rhinorrhea and sinus pressure.   Eyes: Negative for photophobia, pain, redness and visual disturbance.  Respiratory: Negative for apnea, cough, shortness of breath and wheezing.   Cardiovascular: Negative for chest pain and palpitations.  Gastrointestinal: Negative for abdominal pain, constipation, diarrhea, nausea and vomiting.  Endocrine: Negative for cold intolerance, heat intolerance, polydipsia and polyuria.  Genitourinary: Negative for decreased urine volume, dysuria, frequency, hematuria and testicular pain.  Musculoskeletal: Negative for back pain, myalgias and neck pain.  Skin: Negative for pallor and rash.  Allergic/Immunologic: Negative for immunocompromised state.  Neurological: Negative for dizziness, seizures, syncope, speech difficulty and light-headedness.  Hematological: Does not bruise/bleed easily.  Psychiatric/Behavioral: Negative for agitation and hallucinations. The patient is not nervous/anxious.      Vitals:   08/13/18 0926  BP: 136/82  Pulse: 86  Temp: 97.9 F (36.6 C)     Physical Exam Gen: pleasant, NAD, A&Ox 3 Head: NCAT, no temporal wasting evident EENT: PERRL, EOMI, MMM, adequate dentition Neck: supple, no JVD CV: NRRR, no murmurs evident Pulm: CTA bilaterally, no wheeze or retractions Abd:  soft, NTND, +BS Extrems: no LE edema, 2+ pulses Skin: scattered lesions most consistent with picker's papules to his arms and legs sporadically, adequate skin turgor Neuro: CN II-XII grossly intact, no focal neurologic deficits appreciated, gait was normal, A&Ox 3   Labs: Lab Results  Component Value Date   HIV1RNAQUANT 47,400 (H) 07/17/2018    HIV1RNAQUANT 60,440 (H) 10/28/2015     Lab Results  Component Value Date   CD4TCELL 18 (L) 07/17/2018   CD4TABS 166 (L) 07/17/2018     Lab Results  Component Value Date   WBC 4.3 07/17/2018   HGB 12.8 (L) 07/17/2018   HCT 38.7 07/17/2018   MCV 85.4 07/17/2018   PLT 269 07/17/2018       Chemistry      Component Value Date/Time   NA 138 07/17/2018 1120   K 4.2 07/17/2018 1120   CL 103 07/17/2018 1120   CO2 28 07/17/2018 1120   BUN 13 07/17/2018 1120   CREATININE 1.00 07/17/2018 1120      Component Value Date/Time   CALCIUM 9.1 07/17/2018 1120   ALKPHOS 55 10/28/2015 1118   AST 20 07/17/2018 1120   ALT 7 (L) 07/17/2018 1120   BILITOT 0.6 07/17/2018 1120        Assessment/Plan: Patient is a 23 year old bisexual African-American male with a history of medical noncompliance and active furunculosis presenting for reestablishment of HIV care.  HIV/AIDS -due to his medical noncompliance and abstinence from HIV care for over 2 years, patient is now developed AIDS with a decline in his CD4 count to 166 and maintenance of a high viral load of 47,400 copies.  Treatment options were reviewed with the patient, and he has elected to reinitiate antiretroviral treatment with Biktarvy 1 tab p.o. daily.  We will repeat the patient's HIV viral level in 6 weeks.  Our pharmacy staff and myself stressed to the patient the need for 100% compliance with his medications, his lab draws, and his office visits in order to improve his health and ensure that he will have a much better outcome and life expectancy.  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  The patient will be placed on daily Bactrim for PCP prophylaxis, but this also can concurrently assist with preventing secondary infection from these wounds.  Great deal of time was spent explaining to the patient the pathogenesis of his  lesions and need for improved hand hygiene and coping strategies to deal with anxiety.  As a last resort, he was instructed to sleep in white cloth gloves that would serve as a barrier to protect him from cutting his skin accidentally when scratching and also would serve as a feedback loop whenever he sees that the gloves are soiled, so he can begin to recognize his own behaviors at work to extinguish them.  Health maintenance -we will start Bactrim double strength 1 tab daily for PCP/toxoplasma prophylaxis (see comments above regarding furuncuosis treatment). Pneumococcal vaccine with pneumovax was given today (prevnar ordered but was out of supply in our office). Will give prevnar in 8 weeks. Tdap vaccine next due in 2028. RPR and urine GC/chlamydia screens all negative in 07/2018. Vaccination for hepatitis B will be due at his next visit. He has completed vaccination for hepatitis A. Annual TB screening with quantiferon is due with his next labs. He will be due for his annual flu vaccine at his next visit. Check FLP with his next  labs for annual cholesterol screening (pt reminded to be fasting at that time). HPV vaccine series has been completed. Pt will be referred for annual dental cleaning once his HIV viremia is fully suppressed. Condoms and water based lubricants were advised with all sexual encounters.

## 2018-09-17 MED FILL — SULFAMETHOXAZOLE-TMP DS TAB: 800-160 | 30 days supply | Qty: 30 | Fill #1

## 2018-09-17 MED FILL — BIKTARVY 50-200-25 MG TABS: 50-200-25 | 30 days supply | Qty: 30 | Fill #1

## 2018-09-24 ENCOUNTER — Ambulatory Visit: Payer: 59 | Admitting: Infectious Diseases

## 2018-10-29 ENCOUNTER — Other Ambulatory Visit: Payer: Self-pay | Admitting: Infectious Diseases

## 2018-10-29 MED FILL — SULFAMETHOXAZOLE-TMP DS TAB: 800-160 | 30 days supply | Qty: 30 | Fill #0

## 2018-10-29 MED FILL — BIKTARVY 50-200-25 MG TABS: 50-200-25 | 30 days supply | Qty: 30 | Fill #0

## 2018-12-04 ENCOUNTER — Other Ambulatory Visit: Payer: Self-pay | Admitting: *Deleted

## 2018-12-04 ENCOUNTER — Other Ambulatory Visit: Payer: Self-pay

## 2018-12-04 ENCOUNTER — Other Ambulatory Visit: Payer: 59

## 2018-12-04 DIAGNOSIS — B20 Human immunodeficiency virus [HIV] disease: Secondary | ICD-10-CM

## 2018-12-04 MED ORDER — BIKTARVY 50-200-25 MG PO TABS
1.0000 | ORAL_TABLET | Freq: Every day | ORAL | 5 refills | Status: AC
Start: 2018-12-04 — End: ?

## 2018-12-04 MED ORDER — SULFAMETHOXAZOLE-TRIMETHOPRIM 800-160 MG PO TABS: 1.0000 | ORAL_TABLET | Freq: Every day | ORAL | 2 refills | Status: AC

## 2018-12-04 MED FILL — BIKTARVY 50-200-25 MG TABS: 50-200-25 | 30 days supply | Qty: 30 | Fill #1

## 2018-12-04 MED FILL — SULFAMETHOXAZOLE-TMP DS TAB: 800-160 | 30 days supply | Qty: 30 | Fill #1

## 2018-12-10 LAB — HIV-1 RNA QUANT-NO REFLEX-BLD
HIV 1 RNA Quant: 23 copies/mL — ABNORMAL HIGH
HIV-1 RNA Quant, Log: 1.36 Log copies/mL — ABNORMAL HIGH

## 2018-12-12 ENCOUNTER — Other Ambulatory Visit: Payer: Self-pay

## 2018-12-12 ENCOUNTER — Emergency Department (HOSPITAL_COMMUNITY)
Admission: EM | Admit: 2018-12-12 | Discharge: 2018-12-12 | Disposition: A | Discharge status: Home / Self Care | Payer: 59 | Attending: Emergency Medicine | Admitting: Emergency Medicine

## 2018-12-12 DIAGNOSIS — Y929 Unspecified place or not applicable: Secondary | ICD-10-CM | POA: Diagnosis not present

## 2018-12-12 DIAGNOSIS — X58XXXA Exposure to other specified factors, initial encounter: Secondary | ICD-10-CM | POA: Insufficient documentation

## 2018-12-12 DIAGNOSIS — Z79899 Other long term (current) drug therapy: Secondary | ICD-10-CM | POA: Diagnosis not present

## 2018-12-12 DIAGNOSIS — J45909 Unspecified asthma, uncomplicated: Secondary | ICD-10-CM | POA: Diagnosis not present

## 2018-12-12 DIAGNOSIS — S025XXA Fracture of tooth (traumatic), initial encounter for closed fracture: Secondary | ICD-10-CM | POA: Diagnosis not present

## 2018-12-12 DIAGNOSIS — Y939 Activity, unspecified: Secondary | ICD-10-CM | POA: Diagnosis not present

## 2018-12-12 DIAGNOSIS — S0993XA Unspecified injury of face, initial encounter: Secondary | ICD-10-CM | POA: Diagnosis present

## 2018-12-12 DIAGNOSIS — Y999 Unspecified external cause status: Secondary | ICD-10-CM | POA: Insufficient documentation

## 2018-12-12 MED ORDER — PENICILLIN V POTASSIUM 500 MG PO TABS: 500.0000 mg | ORAL_TABLET | Freq: Four times a day (QID) | ORAL | 0 refills | Status: AC

## 2018-12-12 MED ORDER — TRAMADOL HCL 50 MG PO TABS: 50.0000 mg | ORAL_TABLET | Freq: Four times a day (QID) | ORAL | 0 refills | Status: AC | PRN

## 2018-12-12 MED ORDER — IBUPROFEN 800 MG PO TABS: 800.0000 mg | ORAL_TABLET | Freq: Three times a day (TID) | ORAL | 0 refills | Status: AC | PRN

## 2018-12-12 MED ORDER — KETOROLAC TROMETHAMINE 60 MG/2ML IM SOLN
60.0000 mg | Freq: Once | INTRAMUSCULAR | Status: AC
  Administered 2018-12-12: 60 mg via INTRAMUSCULAR
  Filled 2018-12-12: qty 2

## 2018-12-12 NOTE — ED Triage Notes (Signed)
Pt c/o broken upper posterior tooth that has gotten worse over the past week.

## 2018-12-16 NOTE — ED Provider Notes (Signed)
[facility]-EMERGENCY DEPT Provider Note   CSN: [id number] Arrival date & time: 12/12/18  1809     History Chief Complaint  Patient presents with  . Dental Pain    Jacob Jacobson is a 23 y.o. male.  HPI Patient presents to the emergency department with dental pain in the upper right posterior teeth.  The patient states he is tooth that is broken in that region.  Patient states that nothing seems to make the condition better or worse.  Patient states that he took some over-the-counter medications that did not seem to help.  Patient denies chest pain, shortness of breath, neck swelling, throat swelling, tongue swelling, fever or syncope.    Past Medical History:  Diagnosis Date  . Asthma   . HIV (human immunodeficiency virus infection) [facility])     Patient Active Problem List   Diagnosis Date Noted  . Vaccine counseling 04/03/2016  . Human immunodeficiency virus I infection (HCC) 10/28/2015  . Screening examination for venereal disease 10/28/2015  . Encounter for long-term (current) use of medications 10/28/2015    No past surgical history on file.     No family history on file.  Social History   Tobacco Use  . Smoking status: Never Smoker  . Smokeless tobacco: Never Used  Substance Use Topics  . Alcohol use: No  . Drug use: Yes    Types: Marijuana    Home Medications Prior to Admission medications   Medication Sig Start Date End Date Taking? Authorizing Provider  bictegravir-emtricitabine-tenofovir AF (BIKTARVY) 50-200-25 MG TABS tablet Take 1 tablet by mouth daily. 12/04/18   Powers, Arley Phenix, MD  ibuprofen (ADVIL) 800 MG tablet Take 1 tablet (800 mg total) by mouth every 8 (eight) hours as needed. 12/12/18   Miria Cappelli, Cristal Deer, PA-C  naproxen (NAPROSYN) 500 MG tablet Take 1 tablet (500 mg total) by mouth 2 (two) times daily. Patient not taking: Reported on 08/13/2018 09/20/17   Geoffery Lyons, MD  penicillin v potassium (VEETID) 500 MG tablet  Take 1 tablet (500 mg total) by mouth 4 (four) times daily. 12/12/18   Misha Antonini, Cristal Deer, PA-C  sulfamethoxazole-trimethoprim (BACTRIM DS) 800-160 MG tablet Take 1 tablet by mouth daily. 12/04/18   Powers, Arley Phenix, MD  traMADol (ULTRAM) 50 MG tablet Take 1 tablet (50 mg total) by mouth every 6 (six) hours as needed. Patient not taking: Reported on 08/13/2018 09/20/17   Geoffery Lyons, MD  traMADol (ULTRAM) 50 MG tablet Take 1 tablet (50 mg total) by mouth every 6 (six) hours as needed for severe pain. 12/12/18   Charlestine Night, PA-C    Allergies    Patient has no known allergies.  Review of Systems   Review of Systems All other systems negative except as documented in the HPI. All pertinent positives and negatives as reviewed in the HPI. Physical Exam Updated Vital Signs BP (!) 156/89 (BP Location: Left Arm)   Pulse 82   Temp 97.9 F (36.6 C) (Oral)   Resp 16   SpO2 99%   Physical Exam Vitals and nursing note reviewed.  Constitutional:      General: He is not in acute distress.    Appearance: He is well-developed.  HENT:     Head: Normocephalic and atraumatic.     Mouth/Throat:   Eyes:     Pupils: Pupils are equal, round, and reactive to light.  Pulmonary:     Effort: Pulmonary effort is normal.  Skin:    General: Skin is warm  and dry.  Neurological:     Mental Status: He is alert and oriented to person, place, and time.     ED Results / Procedures / Treatments   Labs (all labs ordered are listed, but only abnormal results are displayed) Labs Reviewed - No data to display  EKG None  Radiology No results found.  Procedures Procedures (including critical care time)  Medications Ordered in ED Medications  ketorolac (TORADOL) injection 60 mg (60 mg Intramuscular Given 12/12/18 2005)    ED Course  I have reviewed the triage vital signs and the nursing notes.  Pertinent labs & imaging results that were available during my care of the patient were reviewed  by me and considered in my medical decision making (see chart for details).    MDM Rules/Calculators/A&P     CHA2DS2/VAS Stroke Risk Points      N/A >= 2 Points: High Risk  1 - 1.99 Points: Medium Risk  0 Points: Low Risk    A final score could not be computed because of missing components.: Last  Change: N/A     This score determines the patient's risk of having a stroke if the  patient has atrial fibrillation.      This score is not applicable to this patient. Components are not  calculated.                  Patient is referred to dentistry.  I have advised the patient to return here as needed.  Told to rinse with warm water peroxide 3 times a day. Final Clinical Impression(s) / ED Diagnoses Final diagnoses:  Closed fracture of tooth, initial encounter    Rx / DC Orders ED Discharge Orders         Ordered    ibuprofen (ADVIL) 800 MG tablet  Every 8 hours PRN     12/12/18 1957    traMADol (ULTRAM) 50 MG tablet  Every 6 hours PRN     12/12/18 1957    penicillin v potassium (VEETID) 500 MG tablet  4 times daily     12/12/18 [address] PA-C 12/16/18 [id number]    Lacretia Leigh, MD 12/16/18 1719

## 2018-12-20 ENCOUNTER — Encounter: Payer: 59 | Admitting: Infectious Diseases

## 2018-12-23 ENCOUNTER — Encounter: Payer: 59 | Admitting: Internal Medicine

## 2019-01-08 MED FILL — BIKTARVY 50-200-25 MG TABS: 50-200-25 | 30 days supply | Qty: 30 | Fill #2

## 2019-01-08 MED FILL — SULFAMETHOXAZOLE-TMP DS TAB: 800-160 | 30 days supply | Qty: 30 | Fill #2

## 2019-01-09 ENCOUNTER — Encounter: Payer: Self-pay | Admitting: Internal Medicine

## 2019-01-09 ENCOUNTER — Ambulatory Visit (INDEPENDENT_AMBULATORY_CARE_PROVIDER_SITE_OTHER): Payer: 59 | Admitting: Internal Medicine

## 2019-01-09 ENCOUNTER — Other Ambulatory Visit: Payer: Self-pay

## 2019-01-09 VITALS — BP 110/71 | HR 90 | Temp 98.0°F | Wt 148.0 lb

## 2019-01-09 DIAGNOSIS — B20 Human immunodeficiency virus [HIV] disease: Secondary | ICD-10-CM | POA: Diagnosis not present

## 2019-01-09 DIAGNOSIS — Z9189 Other specified personal risk factors, not elsewhere classified: Secondary | ICD-10-CM

## 2019-01-09 DIAGNOSIS — Z23 Encounter for immunization: Secondary | ICD-10-CM

## 2019-01-09 NOTE — Assessment & Plan Note (Signed)
Discussed the flu shot and given today 

## 2019-01-09 NOTE — Assessment & Plan Note (Signed)
He will continue with Bactrim due to his low CD4 count.  May be able to stop next visit.

## 2019-01-09 NOTE — Progress Notes (Signed)
   Subjective:    Patient ID: Jacob Jacobson, male    DOB: 10/14/1995, 24 y.o.   MRN: [id number]  HPI Here for follow up of HIV Came back into care in August and started on Biktarvy.  CD4 at that time was 166 and viral load up.  He has been on Biktarvy since that time and doing well.  No missed doses.  No new issues.  Some mild abdominal cramping only.  Taking Bactrim as well.     Review of Systems  Constitutional: Negative for unexpected weight change.  Gastrointestinal: Negative for diarrhea and nausea.  Skin: Negative for rash.       Objective:   Physical Exam Eyes:     General: No scleral icterus. Cardiovascular:     Rate and Rhythm: Normal rate and regular rhythm.     Heart sounds: No murmur.  Pulmonary:     Effort: Pulmonary effort is normal.  Neurological:     Mental Status: He is alert.  Psychiatric:        Mood and Affect: Mood normal.   SH: no tobacco        Assessment & Plan:

## 2019-01-09 NOTE — Assessment & Plan Note (Signed)
Viral load is now down to just 23 copies and improving.   Will continue with Biktarvy and recheck in 3 months.

## 2019-02-17 MED FILL — SULFAMETHOXAZOLE-TMP DS TAB: 800-160 | 30 days supply | Qty: 30 | Fill #0

## 2019-02-17 MED FILL — BIKTARVY 50-200-25 MG TABS: 50-200-25 | 30 days supply | Qty: 30 | Fill #0

## 2019-02-19 ENCOUNTER — Encounter: Payer: Self-pay | Admitting: Internal Medicine

## 2019-02-19 ENCOUNTER — Ambulatory Visit (INDEPENDENT_AMBULATORY_CARE_PROVIDER_SITE_OTHER): Payer: 59 | Admitting: Internal Medicine

## 2019-02-19 ENCOUNTER — Other Ambulatory Visit: Payer: Self-pay

## 2019-02-19 DIAGNOSIS — A630 Anogenital (venereal) warts: Secondary | ICD-10-CM | POA: Diagnosis not present

## 2019-02-19 DIAGNOSIS — L739 Follicular disorder, unspecified: Secondary | ICD-10-CM

## 2019-02-19 MED ORDER — BACITRACIN 500 UNIT/GM EX OINT: 1.0000 "application " | TOPICAL_OINTMENT | Freq: Two times a day (BID) | CUTANEOUS | 0 refills | Status: AC

## 2019-02-19 MED ORDER — BACITRACIN 500 UNIT/GM EX OINT: 1.0000 "application " | TOPICAL_OINTMENT | Freq: Two times a day (BID) | CUTANEOUS | 0 refills | Status: DC

## 2019-02-19 NOTE — Assessment & Plan Note (Signed)
He will try a topical antibiotic on it

## 2019-02-19 NOTE — Progress Notes (Signed)
   Subjective:    Patient ID: Jacob Jacobson, male    DOB: June 14, 1995, 24 y.o.   MRN: [id number]  HPI Here for a work in visit.  Noted an open draining area on the left side by his anus.  Not draining now and resolved but has a residual area that is open.  Also small pustules on the right side.  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   Review of Systems  Constitutional: Negative for unexpected weight change.       Objective:   Physical Exam Exam conducted with a chaperone present.  Genitourinary:    Comments: Small condyloma on right side, small pustules on right perirectally with areas of pus c/w folliculitis-like lesions Right side with a small opening but no drainage, no erythema, no tenderness          Assessment & Plan:

## 2019-02-19 NOTE — Assessment & Plan Note (Addendum)
I will refer to surgery for consideration of removal and to evaluate small area of the opening on the left side since he is concerned about it

## 2019-04-02 MED FILL — BIKTARVY 50-200-25 MG TABS: 50-200-25 | 30 days supply | Qty: 30 | Fill #1

## 2019-04-14 ENCOUNTER — Other Ambulatory Visit: Payer: 59

## 2019-04-25 ENCOUNTER — Other Ambulatory Visit: Payer: Self-pay | Admitting: General Surgery

## 2019-04-28 ENCOUNTER — Encounter: Payer: 59 | Admitting: Internal Medicine

## 2019-04-28 MED FILL — BIKTARVY 50-200-25 MG TABS: 50-200-25 | 30 days supply | Qty: 30 | Fill #2

## 2019-04-29 ENCOUNTER — Telehealth: Payer: Self-pay

## 2019-04-29 NOTE — Telephone Encounter (Signed)
Received call from surgery team with biopsy results of perianal lesion. Pathologist called Dr. Maisie Fus to report that biopsy results showed syphilis. Pathologist recommended that the patient get labs for confirmation. Patient was scheduled for labs + office visit yesterday but did not show. Will follow up with patient and forward to provider. Once results are finalized, RN will fax to office.   Cassidey Barrales Loyola Mast, RN

## 2019-05-04 ENCOUNTER — Emergency Department (HOSPITAL_COMMUNITY): Admission: EM | Admit: 2019-05-04 | Discharge: 2019-05-04 | Discharge status: Against Medical Advice | Payer: 59

## 2019-05-04 NOTE — ED Notes (Signed)
Pt approached this NT in the waiting room stating that he did not want to wait. Pt not yet triaged.

## 2019-05-05 ENCOUNTER — Ambulatory Visit (HOSPITAL_COMMUNITY)
Admission: EM | Admit: 2019-05-05 | Discharge: 2019-05-05 | Disposition: A | Discharge status: Home / Self Care | Payer: 59 | Attending: Physician Assistant | Admitting: Physician Assistant

## 2019-05-05 ENCOUNTER — Encounter (HOSPITAL_COMMUNITY): Payer: Self-pay

## 2019-05-05 ENCOUNTER — Other Ambulatory Visit: Payer: Self-pay

## 2019-05-05 DIAGNOSIS — A515 Early syphilis, latent: Secondary | ICD-10-CM | POA: Insufficient documentation

## 2019-05-05 DIAGNOSIS — A64 Unspecified sexually transmitted disease: Secondary | ICD-10-CM | POA: Diagnosis not present

## 2019-05-05 LAB — RPR
RPR Ser Ql: REACTIVE — AB
RPR Titer: 1:32 {titer}

## 2019-05-05 MED ORDER — PENICILLIN G BENZATHINE 1200000 UNIT/2ML IM SUSP
2.4000 10*6.[IU] | Freq: Once | INTRAMUSCULAR | Status: AC
  Administered 2019-05-05: 09:00:00 2.4 10*6.[IU] via INTRAMUSCULAR

## 2019-05-05 MED ORDER — PENICILLIN G BENZATHINE 1200000 UNIT/2ML IM SUSP
INTRAMUSCULAR | Status: AC
  Filled 2019-05-05: qty 4

## 2019-05-05 NOTE — Discharge Instructions (Signed)
We have sent blood work and treated you today  We will notify you of any needs to change your treatment.  1 treatment should be sufficient, however, I want you to schedule follow up with your Infectious Disease doctor in 1 week to discuss re-testing schedule.

## 2019-05-05 NOTE — ED Provider Notes (Signed)
[facility]    CSN: [id number] Arrival date & time: 05/05/19  0801      History   Chief Complaint Chief Complaint  Patient presents with  . STI tx    HPI Jacob Jacobson is a 24 y.o. male.   Patient with past medical history of HIV presents to urgent care for treatment of likely syphilis infection.  He is followed by [facility] and was referred for removal of a condyloma around his rectum.  Surgical office removed portion of the lesion and sent for pathology and returned with evidence of syphilis infection.  He has not had serology confirmation.  He is here for treatment and serologic confirmation.  He also reports having a few skin rashes show up about 2 weeks ago.  He reports 2 lesions on his left arm, 2 lesions on his right leg and one lesion on his right lower back.  He reports the lesion around his rectum and questions started in February.  He denies any lesions on his penis.  He reports he has sex with men to include anal penetrative and anal receptive sexual intercourse.  He denies any other symptoms of penile lesion or penile pain.  No testicular pain reported.  No fevers or chills.  Reports good mentation.     Past Medical History:  Diagnosis Date  . Asthma   . HIV (human immunodeficiency virus infection) [facility])     Patient Active Problem List   Diagnosis Date Noted  . 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 Superficial folliculitis 02/19/2019  . At risk for opportunistic infections 01/09/2019  . Need for prophylactic vaccination and inoculation against influenza 04/03/2016  . Human immunodeficiency virus I infection (HCC) 10/28/2015  . Screening examination for venereal disease 10/28/2015  . Encounter for long-term (current) use of medications 10/28/2015    History reviewed. No pertinent surgical history.     Home Medications    Prior to Admission medications   Medication Sig Start Date End Date Taking? Authorizing Provider  bacitracin 500 UNIT/GM  ointment Apply 1 application topically 2 (two) times daily. For 7 days 02/19/19   Gardiner Barefoot, MD  bictegravir-emtricitabine-tenofovir AF (BIKTARVY) 50-200-25 MG TABS tablet Take 1 tablet by mouth daily. 12/04/18   Powers, Arley Phenix, MD  ibuprofen (ADVIL) 800 MG tablet Take 1 tablet (800 mg total) by mouth every 8 (eight) hours as needed. Patient not taking: Reported on 02/19/2019 12/12/18   Charlestine Night, PA-C  naproxen (NAPROSYN) 500 MG tablet Take 1 tablet (500 mg total) by mouth 2 (two) times daily. Patient not taking: Reported on 02/19/2019 09/20/17   Geoffery Lyons, MD  penicillin v potassium (VEETID) 500 MG tablet Take 1 tablet (500 mg total) by mouth 4 (four) times daily. 12/12/18   Lawyer, Cristal Deer, PA-C  sulfamethoxazole-trimethoprim (BACTRIM DS) 800-160 MG tablet Take 1 tablet by mouth daily. 12/04/18   Powers, Arley Phenix, MD  traMADol (ULTRAM) 50 MG tablet Take 1 tablet (50 mg total) by mouth every 6 (six) hours as needed. Patient not taking: Reported on 02/19/2019 09/20/17   Geoffery Lyons, MD  traMADol (ULTRAM) 50 MG tablet Take 1 tablet (50 mg total) by mouth every 6 (six) hours as needed for severe pain. Patient not taking: Reported on 02/19/2019 12/12/18   Charlestine Night, PA-C    Family History Family History  Problem Relation Age of Onset  . Healthy Mother   . Healthy Father     Social History Social History   Tobacco Use  .  Smoking status: Never Smoker  . Smokeless tobacco: Never Used  Substance Use Topics  . Alcohol use: No  . Drug use: Yes    Types: Marijuana     Allergies   Patient has no known allergies.   Review of Systems Review of Systems Per HPI  Physical Exam Triage Vital Signs ED Triage Vitals  Enc Vitals Group     BP      Pulse      Resp      Temp      Temp src      SpO2      Weight      Height      Head Circumference      Peak Flow      Pain Score      Pain Loc      Pain Edu?      Excl. in [place]?    No data found.  Updated  Vital Signs BP 113/64   Pulse 79   Temp 98.3 F (36.8 C) (Oral)   Resp 16   Ht 5\' 9"  (1.753 m)   Wt 143 lb (64.9 kg)   SpO2 95%   BMI 21.12 kg/m   Visual Acuity Right Eye Distance:   Left Eye Distance:   Bilateral Distance:    Right Eye Near:   Left Eye Near:    Bilateral Near:     Physical Exam Vitals and nursing note reviewed.  Constitutional:      Appearance: He is well-developed.  HENT:     Head: Normocephalic and atraumatic.  Eyes:     Conjunctiva/sclera: Conjunctivae normal.  Cardiovascular:     Rate and Rhythm: Normal rate and regular rhythm.     Heart sounds: No murmur.  Pulmonary:     Effort: Pulmonary effort is normal. No respiratory distress.     Breath sounds: Normal breath sounds.  Genitourinary:    Comments: Deferred Musculoskeletal:     Cervical back: Neck supple.  Skin:    General: Skin is warm and dry.     Comments: 2 circular lesions on left arm.  1 located on upper and 1 located on forearm.  1 oval sore on his lower back.  2 circular on right upper thigh.  No other skin lesions seen.  No involvement of palms or soles of feet.  Neurological:     General: No focal deficit present.     Mental Status: He is alert and oriented to person, place, and time.      UC Treatments / Results  Labs (all labs ordered are listed, but only abnormal results are displayed) Labs Reviewed  RPR - Abnormal; Notable for the following components:      Result Value   RPR Ser Ql Reactive (*)    All other components within normal limits  T.PALLIDUM AB, TOTAL    EKG   Radiology No results found.  Procedures Procedures (including critical care time)  Medications Ordered in UC Medications  penicillin g benzathine (BICILLIN LA) 1200000 UNIT/2ML injection 2.4 Million Units (2.4 Million Units Intramuscular Given 05/05/19 0901)    Initial Impression / Assessment and Plan / UC Course  I have reviewed the triage vital signs and the nursing notes.  Pertinent  labs & imaging results that were available during my care of the patient were reviewed by me and considered in my medical decision making (see chart for details).  [place] pathology result from 04/25/2019 was reviewed.    #Syphilis,  early latent Patient 24 year old patient with HIV followed by infectious disease clinic presenting with recent histopathology diagnosis of syphilis.  Given he does have other skin lesions we will consider this early latent disease.  We will treat with Bicillin today and have him follow-up with his infectious disease clinic for further management. -RPR returned reactive. Final Clinical Impressions(s) / UC Diagnoses   Final diagnoses:  Sexually transmitted disease  Syphilis, early latent     Discharge Instructions     We have sent blood work and treated you today  We will notify you of any needs to change your treatment.  1 treatment should be sufficient, however, I want you to schedule follow up with your Infectious Disease doctor in 1 week to discuss re-testing schedule.      ED Prescriptions    None     PDMP not reviewed this encounter.   Hermelinda Medicus, PA-C 05/06/19 0013

## 2019-05-05 NOTE — ED Triage Notes (Signed)
Pt had a lesion on his penis biopsied to test for CA and was told he had syphillis. PT came here to get tx. The lesion was tested at [facility].

## 2019-05-08 ENCOUNTER — Telehealth (HOSPITAL_COMMUNITY): Payer: Self-pay

## 2019-06-06 ENCOUNTER — Telehealth: Payer: Self-pay | Admitting: Pharmacy Technician

## 2019-06-06 MED FILL — BIKTARVY 50-200-25 MG TABS: 50-200-25 | 30 days supply | Qty: 30 | Fill #3

## 2019-06-06 NOTE — Telephone Encounter (Addendum)
[facility] Patient Advocate Encounter    Findings of the benefits investigation:   Insurance: BCBS- commercial (pending) Patient was covered under his mother's plan with united healthcare. That coverage terminated on his 24th birthday 06/02/2019.  [employer] Advancing Access approved until 06/05/2020. Patient must provide adap denial within 90-days to maintain this status. His insurance should be active before then.   Once his new coverage is finalized, it appears the patient will have to use an approved express scripts pharmacy which I'm concerned might be [facility]. Patient is going to find out from benefits people at his job and let us know at his appointment 06/16.    Jacob Jacobson will ship today to arrive 06/07 to the patient. He reports missing a few doses while traveling.   Jacob Jacobson, CPhT [facility] Phone: [phone] Fax: [phone] 06/06/2019 11:53 AM

## 2019-06-18 ENCOUNTER — Ambulatory Visit: Payer: 59 | Admitting: Internal Medicine

## 2020-11-18 ENCOUNTER — Telehealth: Payer: Self-pay

## 2020-11-18 NOTE — Telephone Encounter (Signed)
Patient last seen 02/2019, called to offer appointment and re-engage in care. Call could not be completed.   Sandie Ano, RN

## 2021-04-14 ENCOUNTER — Telehealth: Payer: Self-pay

## 2021-04-14 NOTE — Telephone Encounter (Signed)
Called patient to offer appointment, call could not be completed.  ? ?Referral sent to state bridge counselor 01/2021. ? ?Jaclynne Baldo D Georgiana Spillane, RN ? ?
